# Patient Record
Sex: Female | Born: 1935 | Race: White | Hispanic: No | Marital: Married | Smoking: Never smoker
Health system: Southern US, Community
[De-identification: ages and names within clinical notes are randomized; demographics above are authoritative.]

## PROBLEM LIST (undated history)

## (undated) DIAGNOSIS — H8390 Unspecified disease of inner ear, unspecified ear: Secondary | ICD-10-CM

## (undated) HISTORY — PX: TONSILLECTOMY: SUR1361

---

## 2012-09-12 ENCOUNTER — Emergency Department (HOSPITAL_COMMUNITY): Payer: Medicare Other

## 2012-09-12 ENCOUNTER — Encounter (HOSPITAL_COMMUNITY): Payer: Self-pay | Admitting: Emergency Medicine

## 2012-09-12 ENCOUNTER — Emergency Department (HOSPITAL_COMMUNITY)
Admission: EM | Admit: 2012-09-12 | Discharge: 2012-09-12 | Disposition: A | Discharge status: Home / Self Care | Payer: Medicare Other | Attending: Emergency Medicine | Admitting: Emergency Medicine

## 2012-09-12 DIAGNOSIS — Z8669 Personal history of other diseases of the nervous system and sense organs: Secondary | ICD-10-CM | POA: Insufficient documentation

## 2012-09-12 DIAGNOSIS — S0990XA Unspecified injury of head, initial encounter: Secondary | ICD-10-CM

## 2012-09-12 DIAGNOSIS — Y939 Activity, unspecified: Secondary | ICD-10-CM | POA: Insufficient documentation

## 2012-09-12 DIAGNOSIS — W19XXXA Unspecified fall, initial encounter: Secondary | ICD-10-CM

## 2012-09-12 DIAGNOSIS — Y929 Unspecified place or not applicable: Secondary | ICD-10-CM | POA: Insufficient documentation

## 2012-09-12 DIAGNOSIS — M549 Dorsalgia, unspecified: Secondary | ICD-10-CM

## 2012-09-12 DIAGNOSIS — W138XXA Fall from, out of or through other building or structure, initial encounter: Secondary | ICD-10-CM | POA: Insufficient documentation

## 2012-09-12 HISTORY — DX: Unspecified disease of inner ear, unspecified ear: H83.90

## 2012-09-12 MED ORDER — IBUPROFEN 800 MG PO TABS
800.0000 mg | ORAL_TABLET | Freq: Once | ORAL | Status: AC
  Administered 2012-09-12: 800 mg via ORAL
  Filled 2012-09-12: qty 1

## 2012-09-12 NOTE — ED Notes (Signed)
Patient fell from a standing position at home today.  She was dizzy at time of fall and reports history of inner ear problems.  No other medical history.  Hematoma to crown of head.  No loss of conciousness.

## 2012-09-12 NOTE — ED Provider Notes (Signed)
History     CSN: [id number]  Arrival date & time 09/12/12  1616   First MD Initiated Contact with Patient 09/12/12 1627      Chief Complaint  Patient presents with  . Fall    (Consider location/radiation/quality/duration/timing/severity/associated sxs/prior treatment) HPI Comments: Pt states that she was trying to catch her dog and she bent over to fast and fell forward off of raised area on porch:denies loc:pt states that she has"inner ear problems" and when she changes position to fast she gets dizzy  Patient is a 77 y.o. female presenting with fall. The history is provided by the patient. No language interpreter was used.  Fall The accident occurred less than 1 hour ago. She fell from a height of 1 to 2 ft. She landed on concrete. There was no blood loss. The point of impact was the head (back). Pain location: lower back. The pain is moderate. She was ambulatory at the scene. There was no entrapment after the fall. There was no drug use involved in the accident. There was no alcohol use involved in the accident. Pertinent negatives include no fever, no nausea, no headaches, no hearing loss and no loss of consciousness. The symptoms are aggravated by activity.    Past Medical History  Diagnosis Date  . Inner ear dysfunction     History reviewed. No pertinent past surgical history.  No family history on file.  History  Substance Use Topics  . Smoking status: Not on file  . Smokeless tobacco: Not on file  . Alcohol Use: Not on file    OB History   Grav Para Term Preterm Abortions TAB SAB Ect Mult Living                  Review of Systems  Constitutional: Negative for fever.  Eyes: Negative for visual disturbance.  Respiratory: Negative.   Gastrointestinal: Negative for nausea.  Neurological: Negative for loss of consciousness and headaches.    Allergies  Review of patient's allergies indicates not on file.  Home Medications  No current outpatient prescriptions  on file.  BP 170/68  Pulse 74  Temp(Src) 98.7 F (37.1 C) (Oral)  Resp 20  Wt 168 lb (76.204 kg)  SpO2 98%  Physical Exam  Nursing note and vitals reviewed. Constitutional: She is oriented to person, place, and time. She appears well-developed and well-nourished.  HENT:  Right Ear: External ear normal.  Left Ear: External ear normal.  Posterior scalp hematoma noted  Eyes: Conjunctivae and EOM are normal. Pupils are equal, round, and reactive to light.  Neck: Normal range of motion. Neck supple.  Cardiovascular: Normal rate and regular rhythm.   Pulmonary/Chest: Effort normal and breath sounds normal.  Musculoskeletal: Normal range of motion.       Cervical back: She exhibits no bony tenderness.       Thoracic back: Normal.       Lumbar back: She exhibits bony tenderness. She exhibits no swelling and no deformity.  Neurological: She is alert and oriented to person, place, and time.  Skin: Skin is warm and dry.  Psychiatric: She has a normal mood and affect.    ED Course  Procedures (including critical care time)  Labs Reviewed - No data to display Dg Cervical Spine Complete  09/12/2012  *RADIOLOGY REPORT*  Clinical Data: Larey Seat and hit head.  No neck pain.  Pain across the lower back.  CERVICAL SPINE - COMPLETE 4+ VIEW  Comparison: None.  Findings: There are moderate  degenerative changes in the mid cervical spine, most notably at C5-6 and C6-7.  There is no evidence for acute fracture or dislocation however.  Prevertebral soft tissues have a normal appearance.  Lung apices are clear.  IMPRESSION:  1.  Degenerative changes. 2.  No evidence for acute abnormality.   Original Report Authenticated By: Norva Pavlov, M.D.    Dg Lumbar Spine Complete  09/12/2012  *RADIOLOGY REPORT*  Clinical Data: Low back pain secondary to a fall.  LUMBAR SPINE - COMPLETE 4+ VIEW  Comparison: None.  Findings: There is no fracture or subluxation.  The patient has moderate degenerative disc disease at  L3-4 and L4-5 and to a lesser degree at L1-2.  There is moderately severe bilateral facet arthritis at L4-5 and L5-S1 and to a lesser degree at L3-4.  No acute abnormalities.  IMPRESSION: Multilevel degenerative disc and joint disease in the lumbar spine. No acute abnormality.   Original Report Authenticated By: Francene Boyers, M.D.    Ct Head Wo Contrast  09/12/2012  *RADIOLOGY REPORT*  Clinical Data: Fall, hematoma to back of head  CT HEAD WITHOUT CONTRAST  Technique:  Contiguous axial images were obtained from the base of the skull through the vertex without contrast.  Comparison: None  Findings: Normal ventricular morphology. No midline shift or mass effect. Small vessel chronic ischemic changes of deep cerebral white matter. No intracranial hemorrhage, mass lesion or evidence of acute infarction. No extra-axial fluid collections. Visualized paranasal sinuses and mastoid air cells clear. Skull intact.  IMPRESSION: No acute intracranial abnormalities. Atrophy with small vessel chronic ischemic changes of deep cerebral white matter.   Original Report Authenticated By: Ulyses Southward, M.D.      1. Fall, initial encounter   2. Back pain   3. Head injury, initial encounter       MDM  No acute abnormality noted at this time:pt is okay to follow up as needed:pt states that she doesn't want anything for pain:pt ambulatory without any problem        Teressa Lower, NP 09/12/12 1803  Teressa Lower, NP 09/12/12 1807

## 2012-09-12 NOTE — ED Notes (Signed)
[id number]<AV> Expected date:<BR> Expected time:<BR> Means of arrival:<BR> Comments:<BR> Fall-hematoma to side of head

## 2012-09-12 NOTE — ED Provider Notes (Signed)
  Medical screening examination/treatment/procedure(s) were performed by non-physician practitioner and as supervising physician I was immediately available for consultation/collaboration.    Robert Lockwood, MD 09/12/12 2352 

## 2014-10-17 IMAGING — CT CT HEAD W/O CM
2 series · 16 of 30 positions shown, 20 images · non-contrast
Comparison: None

CLINICAL DATA: Fall, hematoma to back of head

CT HEAD WITHOUT CONTRAST
TECHNIQUE: Contiguous axial images were obtained from the base of
the skull through the vertex without contrast.

[Series 2: head w/o · axial · non-contrast · 0.43mm/px · z∈[-479,-359]mm · 13 of 30 slices shown, 17 images]
[im 3/30  brain]
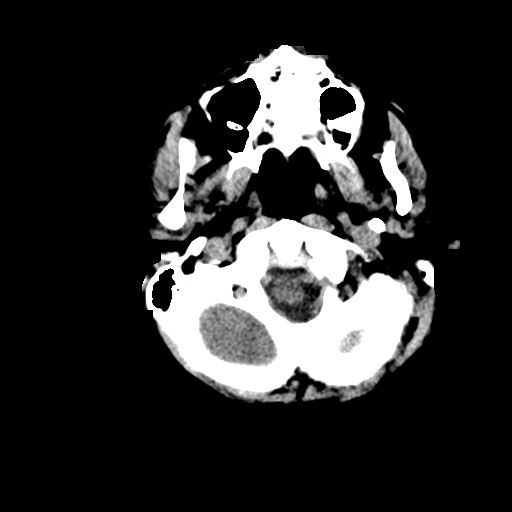
[im 3/30  bone]
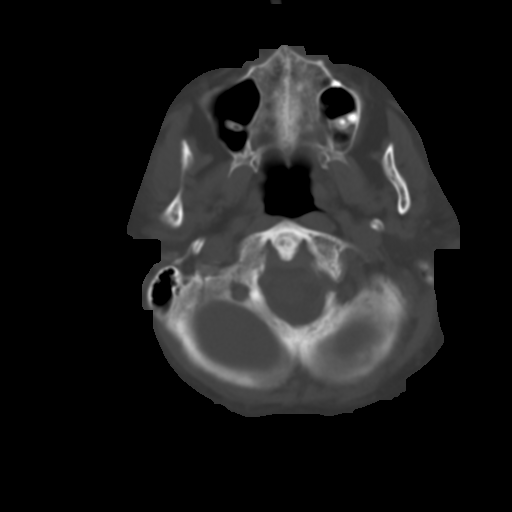
[im 5/30  brain]
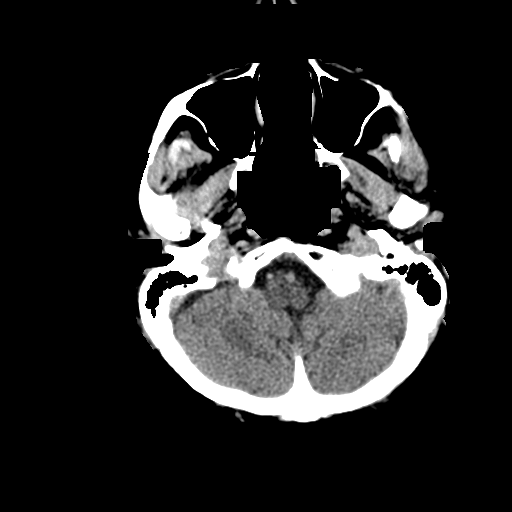
[im 7/30  brain]
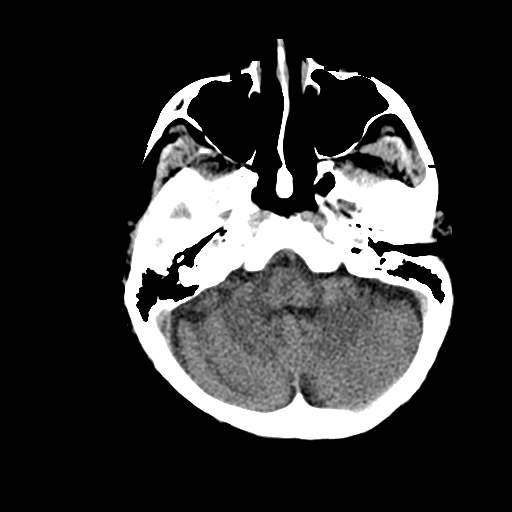
[im 9/30  brain]
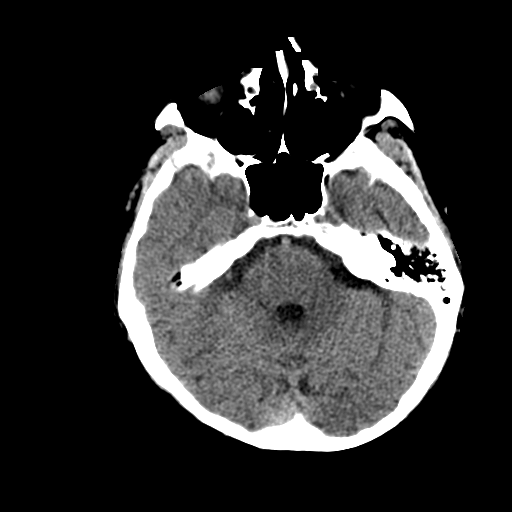
[im 11/30  brain]
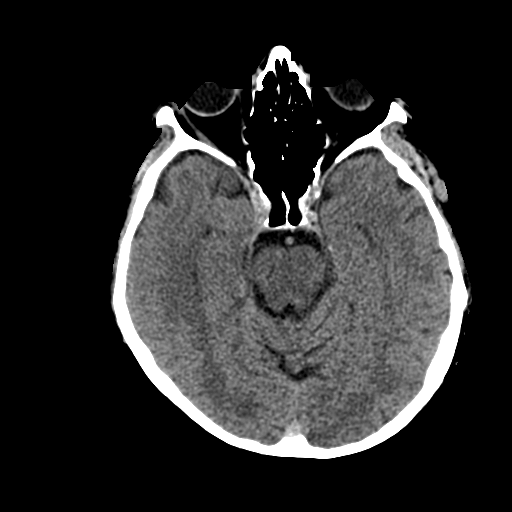
[im 11/30  bone]
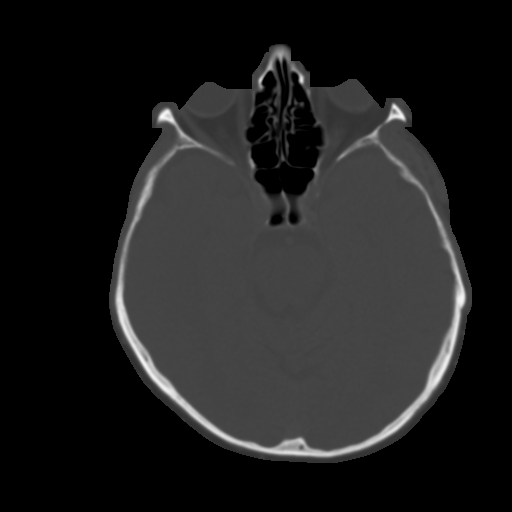
[im 13/30  brain]
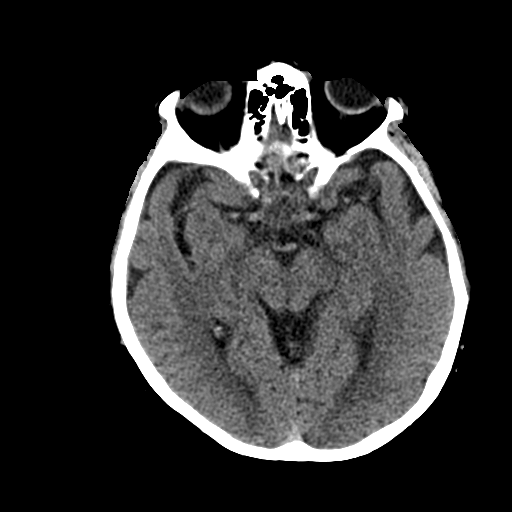
[im 15/30  brain]
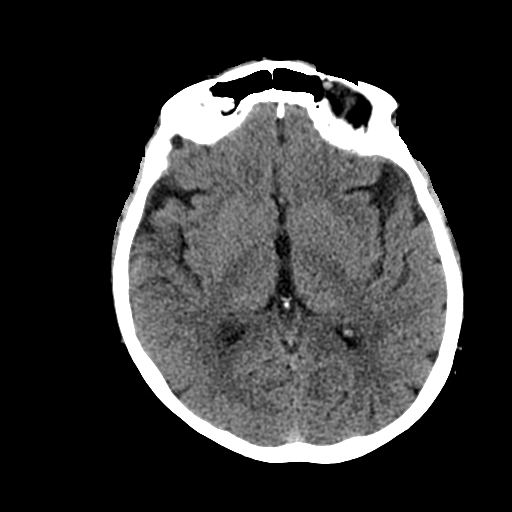
[im 17/30  brain]
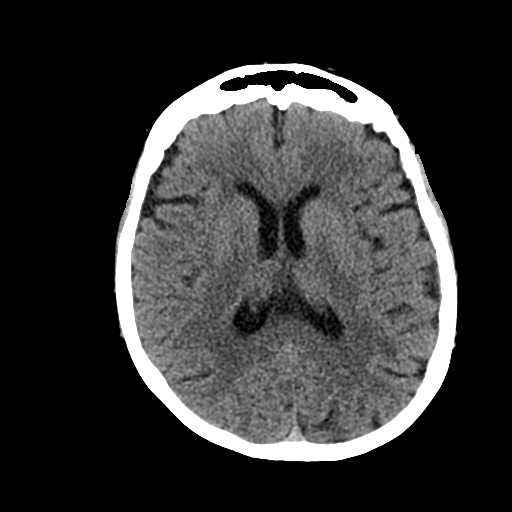
[im 19/30  brain]
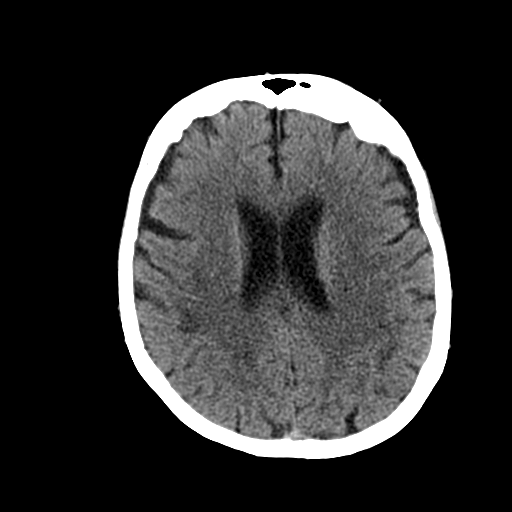
[im 19/30  bone]
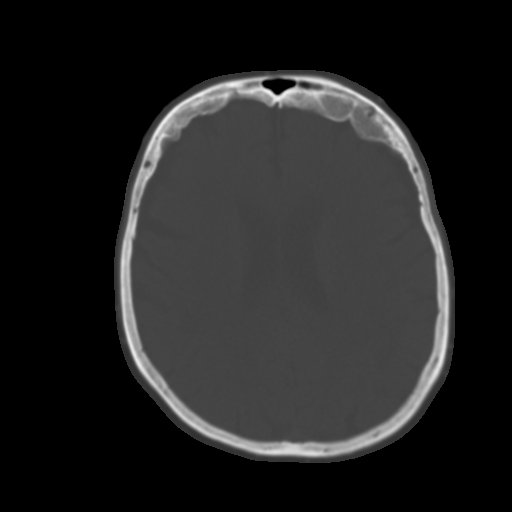
[im 21/30  brain]
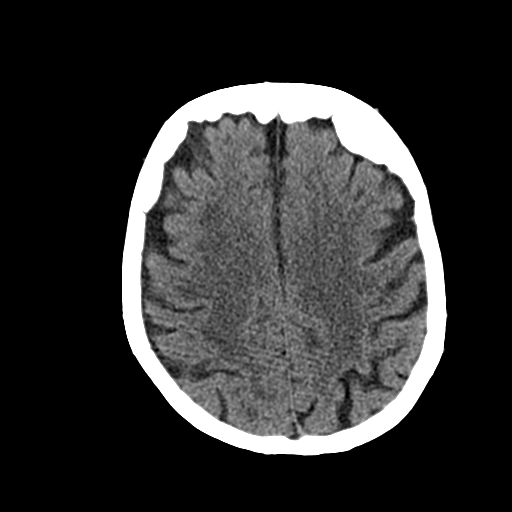
[im 23/30  brain]
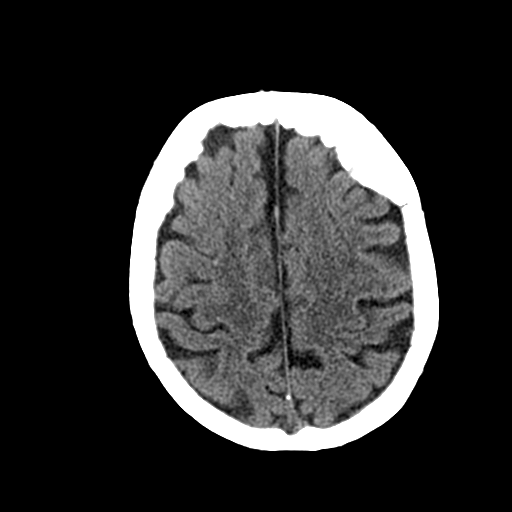
[im 25/30  brain]
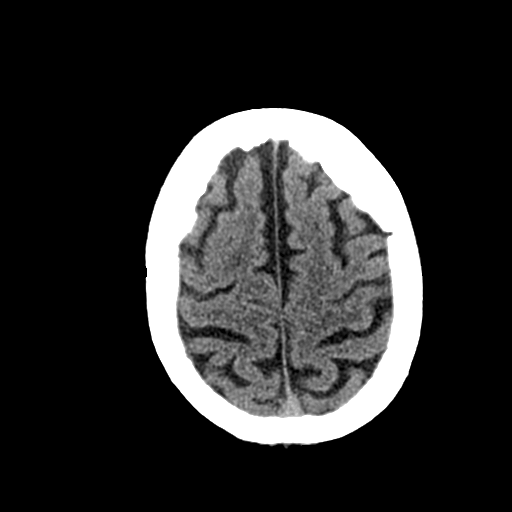
[im 27/30  brain]
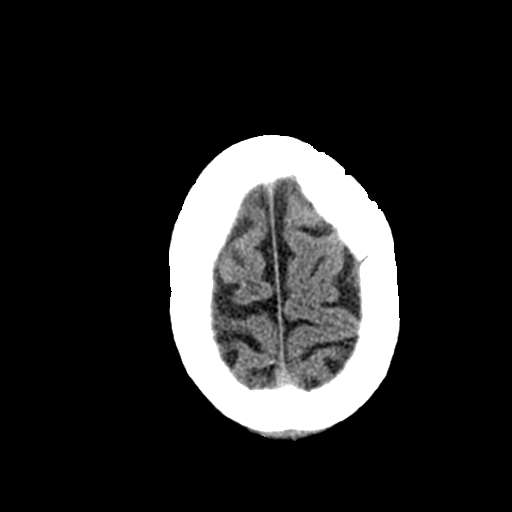
[im 27/30  bone]
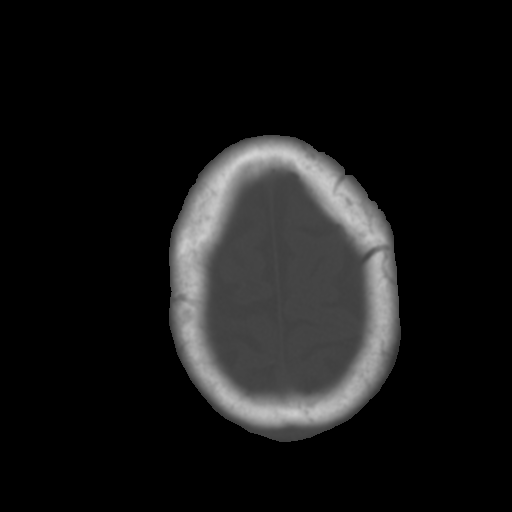

[Series 3: bone windows · axial · 0.43mm/px · z∈[-479,-439]mm · 3 of 30 slices shown]
[im 3/30  bone]
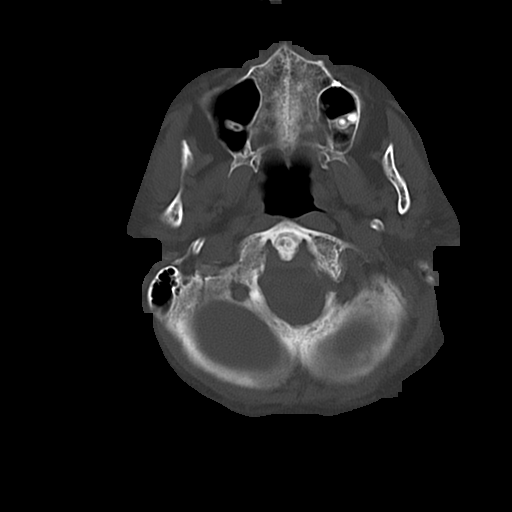
[im 7/30  bone]
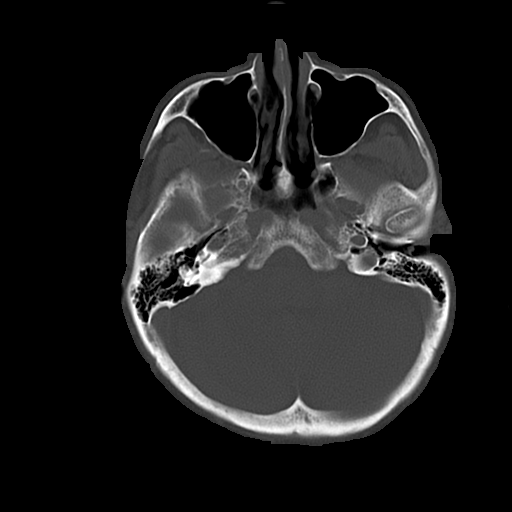
[im 11/30  bone]
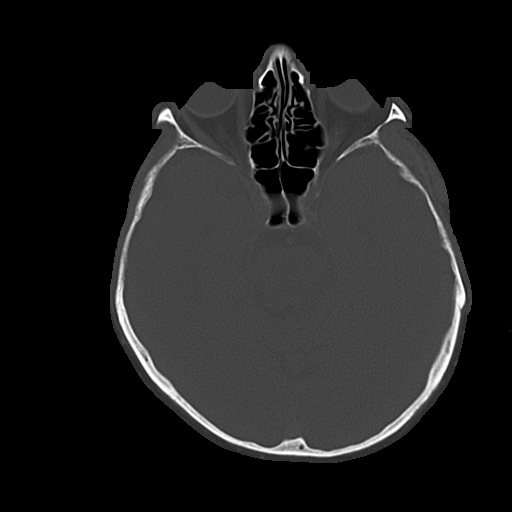

[16 of 30 positions shown; findings below may reference images not displayed]

FINDINGS: Normal ventricular morphology.
No midline shift or mass effect.
Small vessel chronic ischemic changes of deep cerebral white
matter.
No intracranial hemorrhage, mass lesion or evidence of acute
infarction.
No extra-axial fluid collections.
Visualized paranasal sinuses and mastoid air cells clear.
Skull intact.
IMPRESSION: No acute intracranial abnormalities.
Atrophy with small vessel chronic ischemic changes of deep cerebral
white matter.

## 2020-05-30 ENCOUNTER — Encounter (HOSPITAL_BASED_OUTPATIENT_CLINIC_OR_DEPARTMENT_OTHER): Payer: Self-pay | Admitting: Emergency Medicine

## 2020-05-30 ENCOUNTER — Emergency Department (HOSPITAL_BASED_OUTPATIENT_CLINIC_OR_DEPARTMENT_OTHER)
Admission: EM | Admit: 2020-05-30 | Discharge: 2020-05-30 | Disposition: A | Discharge status: Home / Self Care | Payer: Medicare Other | Attending: Emergency Medicine | Admitting: Emergency Medicine

## 2020-05-30 ENCOUNTER — Other Ambulatory Visit: Payer: Self-pay

## 2020-05-30 DIAGNOSIS — R030 Elevated blood-pressure reading, without diagnosis of hypertension: Secondary | ICD-10-CM | POA: Diagnosis present

## 2020-05-30 NOTE — ED Triage Notes (Addendum)
States," I was at the dentist last Monday and they told me I had high BP" States took her BP this am and was 198/78 and last BP was 220/90ish. Denies dizziness, blurred vision or H/A . Admits to being anxious due to up coming dental procedures

## 2020-05-30 NOTE — ED Provider Notes (Addendum)
[facility] EMERGENCY DEPARTMENT Provider Note   CSN: [id number] Arrival date & time: 05/30/20  1413     History Chief Complaint  Patient presents with  . Hypertension    Paige Horne is a 84 y.o. female.  HPI Patient is an 84 year old female with past medical history without any significant positives.   Patient is 84 year old female with no pertinent past medical history presented today with elevated blood pressure she was told that it was high at the dentist on Monday and since then she has checked her blood pressure frequently with a home cuff she states that it was elevated at 220/90  She informs me that she sometimes has elevated blood pressure at the dentist or other places when she has some anxiety or stressors.  She states that she feels mildly anxious now but much improved from when she was in the dentist office.  She denies any chest pain, shortness of breath, lightheadedness, dizziness, headache, nausea, vomiting, fevers, chills or any other associated symptoms.  No aggravating mitigating factors apart from her blood pressure being worse when she feels anxious.  She states that she has dealt with anxiety for years she will talk to her primary care doctor in the future about medication for anxiety and depression however she states she has not been around this in the past.    Past Medical History:  Diagnosis Date  . Inner ear dysfunction     There are no problems to display for this patient.   Past Surgical History:  Procedure Laterality Date  . TONSILLECTOMY       OB History   No obstetric history on file.     No family history on file.  Social History   Tobacco Use  . Smoking status: Never Smoker  . Smokeless tobacco: Never Used  Substance Use Topics  . Alcohol use: Never  . Drug use: Never    Home Medications Prior to Admission medications   Medication Sig Start Date End Date Taking? Authorizing Provider  Digestive Enzymes (PAPAYA  ENZYME PO) Take 1-6 tablets by mouth.   Yes [provider]  famotidine-calcium carbonate-magnesium hydroxide (PEPCID COMPLETE) [phone] MG CHEW Chew 1 tablet by mouth daily as needed (stomach upset.).   Yes [provider]  loratadine (ALAVERT) 10 MG tablet Take 10 mg by mouth as needed for allergies.   Yes [provider]  triamcinolone (NASACORT) 55 MCG/ACT nasal inhaler Place 1 spray into the nose daily as needed (congestion.).   Yes [provider]  Pseudoephedrine-Ibuprofen (ADVIL COLD/SINUS PO) Take 1 tablet by mouth as needed (pain/congestion. m).    [provider]    Allergies    Ampicillin  Review of Systems   Review of Systems  Constitutional: Negative for fever.       Elevated blood pressure  HENT: Negative for congestion.   Respiratory: Negative for shortness of breath.   Cardiovascular: Negative for chest pain.  Gastrointestinal: Negative for abdominal distention.  Neurological: Negative for dizziness and headaches.    Physical Exam Updated Vital Signs BP (!) 188/66 (BP Location: Right Arm)   Pulse 95   Temp 98.5 F (36.9 C) (Oral)   Resp 16   Ht 5\' 2"  (1.575 m)   Wt 77.1 kg   SpO2 99%   BMI 31.09 kg/m   Physical Exam Vitals and nursing note reviewed.  Constitutional:      General: She is not in acute distress.    Appearance: Normal appearance. She is  not ill-appearing.  HENT:     Head: Normocephalic and atraumatic.     Nose: Nose normal.     Mouth/Throat:     Mouth: Mucous membranes are moist.  Eyes:     General: No scleral icterus.       Right eye: No discharge.        Left eye: No discharge.     Conjunctiva/sclera: Conjunctivae normal.  Neck:     Comments: No JVD Cardiovascular:     Rate and Rhythm: Normal rate and regular rhythm.     Pulses: Normal pulses.     Heart sounds: Normal heart sounds.     Comments: Regular rate and rhythm no murmurs rubs or gallops.  Radial pulses 3+ and symmetric  bilaterally Pulmonary:     Effort: Pulmonary effort is normal. No respiratory distress.     Breath sounds: No stridor. No wheezing.  Abdominal:     Palpations: Abdomen is soft.     Tenderness: There is no abdominal tenderness.  Musculoskeletal:     Cervical back: Normal range of motion.     Right lower leg: No edema.     Left lower leg: No edema.  Skin:    General: Skin is warm and dry.     Capillary Refill: Capillary refill takes less than 2 seconds.  Neurological:     Mental Status: She is alert and oriented to person, place, and time. Mental status is at baseline.     Comments: Alert and oriented to self, place, time and event.   Speech is fluent, clear without dysarthria or dysphasia.   Strength 5/5 in upper/lower extremities  Sensation intact in upper/lower extremities   Normal gait.  Negative Romberg. No pronator drift.  Normal finger-to-nose and feet tapping.  CN I not tested  CN II grossly intact visual fields bilaterally. Did not visualize posterior eye.   CN III, IV, VI PERRLA and EOMs intact bilaterally  CN V Intact sensation to sharp and light touch to the face  CN VII facial movements symmetric  CN VIII not tested  CN IX, X no uvula deviation, symmetric rise of soft palate  CN XI 5/5 SCM and trapezius strength bilaterally  CN XII Midline tongue protrusion, symmetric L/R movements   Psychiatric:        Mood and Affect: Mood normal.        Behavior: Behavior normal.     ED Results / Procedures / Treatments   Labs (all labs ordered are listed, but only abnormal results are displayed) Labs Reviewed - No data to display  EKG None  Radiology No results found.  Procedures Procedures (including critical care time)  Medications Ordered in ED Medications - No data to display  ED Course  I have reviewed the triage vital signs and the nursing notes.  Pertinent labs & imaging results that were available during my care of the patient were reviewed by me  and considered in my medical decision making (see chart for details).    MDM Rules/Calculators/A&P                          Patient is 84 year old female with no pertinent past medical history presented today with elevated blood pressure she was told that it was high at the dentist on Monday and since then she has checked her blood pressure frequently with a home cuff she states that it was elevated at 220/90  She informs me that  she sometimes has elevated blood pressure at the dentist or other places when she has some anxiety or stressors.  She states that she feels mildly anxious now but much improved from when she was in the dentist office.  I discussed this case with my attending physician who cosigned this note.  She will follow up with her primary care doctor to further discuss blood pressure management.  She has no other concerns at this time.   She did mention to me that she has had occasional palpitations that last 1-2 beats over many years. She actually has declined EKG today she is well-appearing and she denies any lightheadedness dizziness chest pain shortness of breath or any other symptoms.  She states that she would like to go home.  After shared decision making her physician I believe patient has a fair understanding of the need for close follow-up and she was given strict return precautions.  Final Clinical Impression(s) / ED Diagnoses Final diagnoses:  Elevated blood pressure reading    Rx / DC Orders ED Discharge Orders    None       Gailen Shelter, [place] 05/30/20 1537    Solon Augusta [place], [place] 05/30/20 1538    Tegeler, Canary Brim, MD 05/30/20 1549

## 2020-05-30 NOTE — Discharge Instructions (Addendum)
Please take your blood pressure at most only once per day.  You may keep a log to document these blood pressures.  Please follow-up with your primary care doctor for blood pressure recheck and for further care and discussion of treating her elevated numbers.  As we discussed this is very likely related to some anxiety.  Minimize return to the ER for any new or concerning symptoms.  There is nonspecific number that I would recommend coming to the ER for for your blood pressure but rather to monitor your symptoms.  I have provided you with the phone number for a cardiologist in case you begin having more frequent heart palpitations in which case you may follow-up with them.  Your EKG today was reassuring.

## 2020-05-30 NOTE — ED Notes (Addendum)
States had a tooth fall out last week saw the dentist and her pressure was up and today she states she took her bp and it was up agagin states feels fine  But then she took her bp again no numbness no tingling speech is clear, walks well no issues

## 2020-05-30 NOTE — ED Notes (Signed)
Pt refused EKG.
